# Patient Record
Sex: Female | Born: 1975 | Race: Black or African American | Hispanic: No | Smoking: Current every day smoker
Health system: Southern US, Community
[De-identification: ages and names within clinical notes are randomized; demographics above are authoritative.]

## PROBLEM LIST (undated history)

## (undated) DIAGNOSIS — S86012A Strain of left Achilles tendon, initial encounter: Secondary | ICD-10-CM

## (undated) DIAGNOSIS — J302 Other seasonal allergic rhinitis: Secondary | ICD-10-CM

---

## 1998-01-25 ENCOUNTER — Encounter: Payer: Self-pay | Admitting: Emergency Medicine

## 1998-01-25 ENCOUNTER — Emergency Department (HOSPITAL_COMMUNITY): Admission: EM | Admit: 1998-01-25 | Discharge: 1998-01-25 | Payer: Self-pay | Admitting: Emergency Medicine

## 1999-04-12 ENCOUNTER — Emergency Department (HOSPITAL_COMMUNITY): Admission: EM | Admit: 1999-04-12 | Discharge: 1999-04-12 | Payer: Self-pay | Admitting: Emergency Medicine

## 2001-08-04 ENCOUNTER — Emergency Department (HOSPITAL_COMMUNITY): Admission: EM | Admit: 2001-08-04 | Discharge: 2001-08-04 | Payer: Self-pay

## 2001-08-04 ENCOUNTER — Encounter: Payer: Self-pay | Admitting: Emergency Medicine

## 2006-05-08 ENCOUNTER — Emergency Department (HOSPITAL_COMMUNITY): Admission: EM | Admit: 2006-05-08 | Discharge: 2006-05-08 | Payer: Self-pay | Admitting: Emergency Medicine

## 2006-07-03 ENCOUNTER — Emergency Department (HOSPITAL_COMMUNITY): Admission: EM | Admit: 2006-07-03 | Discharge: 2006-07-03 | Payer: Self-pay | Admitting: Emergency Medicine

## 2006-07-15 ENCOUNTER — Emergency Department (HOSPITAL_COMMUNITY): Admission: EM | Admit: 2006-07-15 | Discharge: 2006-07-15 | Payer: Self-pay | Admitting: Emergency Medicine

## 2006-07-18 ENCOUNTER — Emergency Department (HOSPITAL_COMMUNITY): Admission: EM | Admit: 2006-07-18 | Discharge: 2006-07-18 | Payer: Self-pay | Admitting: Family Medicine

## 2007-07-31 ENCOUNTER — Emergency Department (HOSPITAL_COMMUNITY): Admission: EM | Admit: 2007-07-31 | Discharge: 2007-07-31 | Payer: Self-pay | Admitting: Family Medicine

## 2007-12-30 ENCOUNTER — Emergency Department (HOSPITAL_COMMUNITY): Admission: EM | Admit: 2007-12-30 | Discharge: 2007-12-30 | Payer: Self-pay | Admitting: Emergency Medicine

## 2008-01-22 ENCOUNTER — Inpatient Hospital Stay (HOSPITAL_COMMUNITY): Admission: AD | Admit: 2008-01-22 | Discharge: 2008-01-22 | Payer: Self-pay | Admitting: Obstetrics & Gynecology

## 2008-03-16 ENCOUNTER — Inpatient Hospital Stay (HOSPITAL_COMMUNITY): Admission: EM | Admit: 2008-03-16 | Discharge: 2008-03-18 | Payer: Self-pay | Admitting: Emergency Medicine

## 2008-03-25 ENCOUNTER — Inpatient Hospital Stay (HOSPITAL_COMMUNITY): Admission: EM | Admit: 2008-03-25 | Discharge: 2008-04-06 | Payer: Self-pay | Admitting: Emergency Medicine

## 2008-03-26 ENCOUNTER — Encounter (INDEPENDENT_AMBULATORY_CARE_PROVIDER_SITE_OTHER): Payer: Self-pay | Admitting: Interventional Radiology

## 2008-03-29 ENCOUNTER — Ambulatory Visit: Payer: Self-pay | Admitting: Thoracic Surgery

## 2008-03-30 ENCOUNTER — Encounter: Payer: Self-pay | Admitting: Thoracic Surgery

## 2008-03-30 HISTORY — PX: VIDEO ASSISTED THORACOSCOPY (VATS)/EMPYEMA: SHX6172

## 2008-04-12 ENCOUNTER — Ambulatory Visit: Payer: Self-pay | Admitting: Infectious Diseases

## 2008-04-13 ENCOUNTER — Ambulatory Visit: Payer: Self-pay | Admitting: Thoracic Surgery

## 2008-04-13 ENCOUNTER — Encounter: Admission: RE | Admit: 2008-04-13 | Discharge: 2008-04-13 | Payer: Self-pay | Admitting: Thoracic Surgery

## 2008-05-04 ENCOUNTER — Ambulatory Visit: Payer: Self-pay | Admitting: *Deleted

## 2008-05-04 ENCOUNTER — Ambulatory Visit: Payer: Self-pay | Admitting: Family Medicine

## 2008-05-05 ENCOUNTER — Encounter: Admission: RE | Admit: 2008-05-05 | Discharge: 2008-05-05 | Payer: Self-pay | Admitting: Thoracic Surgery

## 2008-05-05 ENCOUNTER — Ambulatory Visit: Payer: Self-pay | Admitting: Thoracic Surgery

## 2008-12-02 ENCOUNTER — Telehealth (INDEPENDENT_AMBULATORY_CARE_PROVIDER_SITE_OTHER): Payer: Self-pay | Admitting: *Deleted

## 2009-02-03 ENCOUNTER — Ambulatory Visit: Payer: Self-pay | Admitting: Family Medicine

## 2009-07-15 ENCOUNTER — Emergency Department (HOSPITAL_COMMUNITY): Admission: EM | Admit: 2009-07-15 | Discharge: 2009-07-15 | Payer: Self-pay | Admitting: Family Medicine

## 2009-07-15 IMAGING — CR DG CHEST 1V PORT
1 series · 1 of 1 positions shown · non-contrast
Comparison: 04/01/2008

CLINICAL DATA: Pneumonia

PORTABLE CHEST - 1 VIEW

[view not recorded]
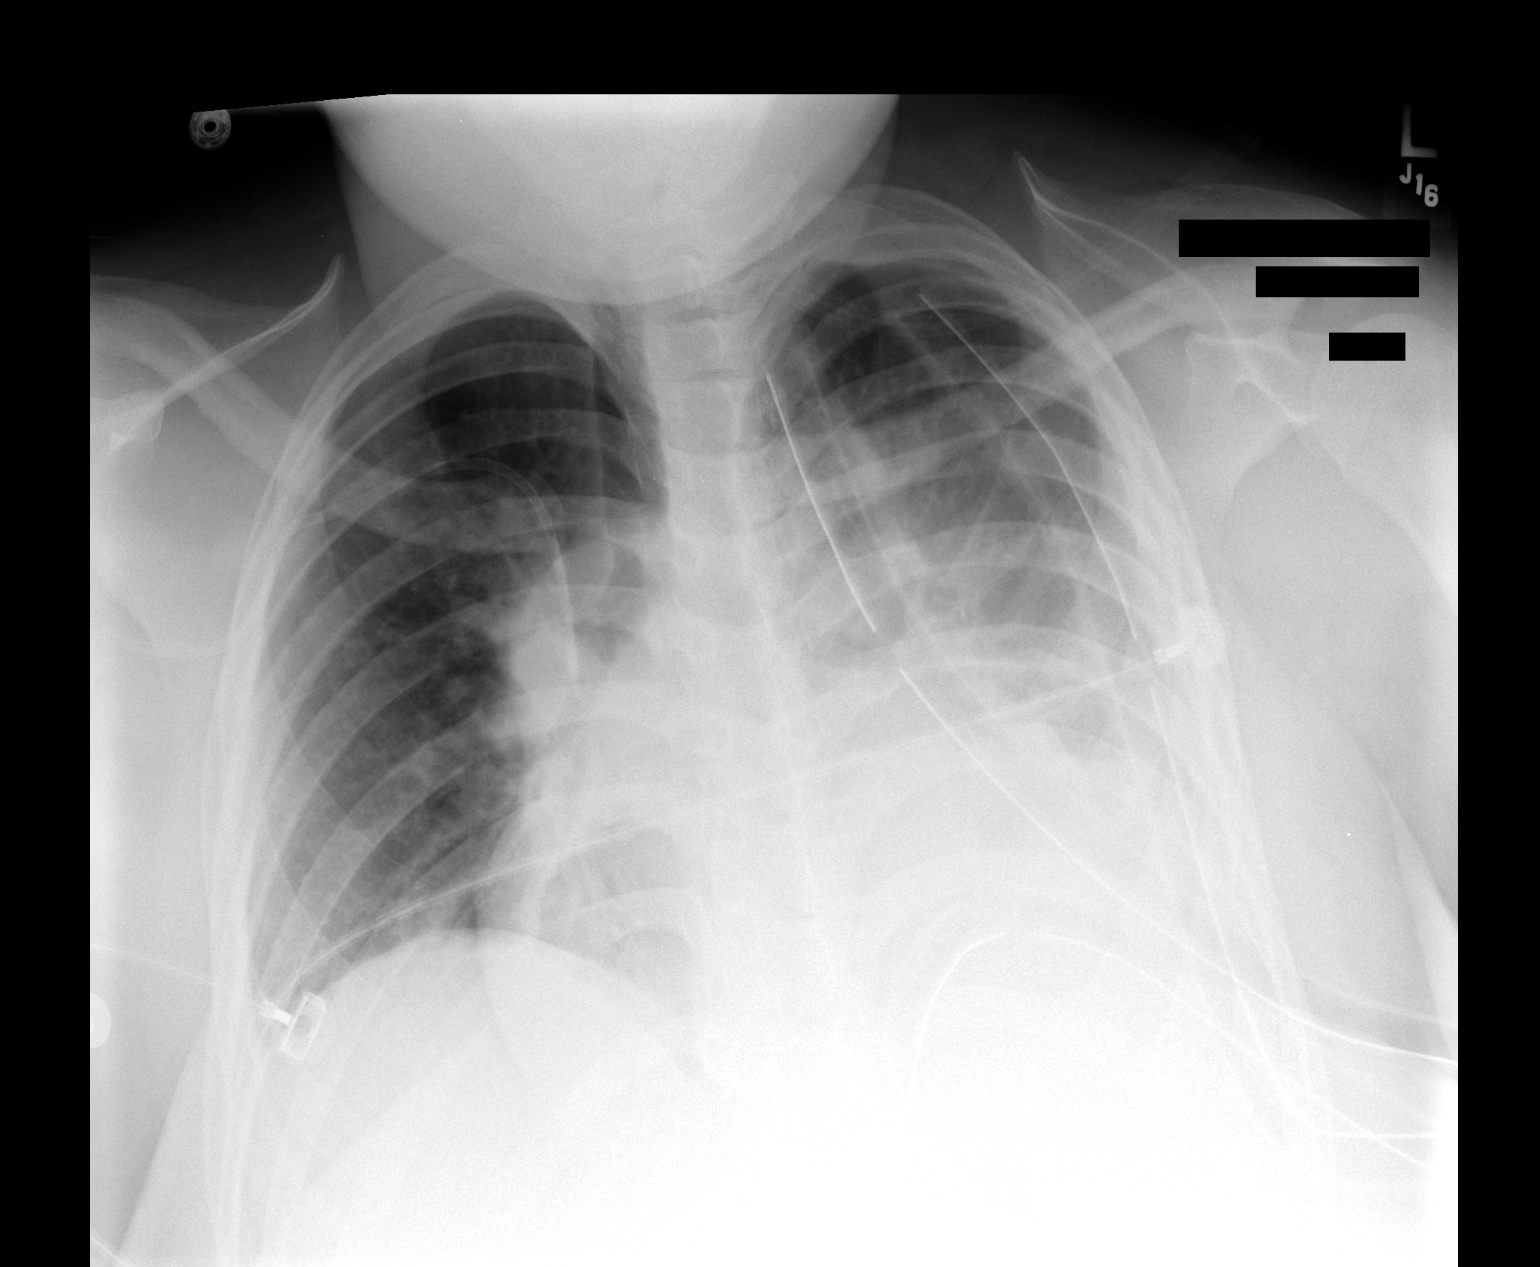

[1 of 1 positions shown; findings below may reference images not displayed]

FINDINGS: Cardiomediastinal silhouette is stable.  No change in
aeration.  No change in left chest tubes position.  No diagnostic
pneumothorax.  No change in the right central venous line position.
Persistent left-sided mild pleural thickening/fluid.
IMPRESSION: No significant change.  Stable aeration.  Stable left chest tubes
position.  No diagnostic pneumothorax.

## 2009-07-18 IMAGING — CR DG CHEST 2V
2 series · 2 of 2 positions shown · non-contrast
Comparison: 04/04/2008

CLINICAL DATA: Pneumonia.

CHEST - 2 VIEW

[w chest pa]
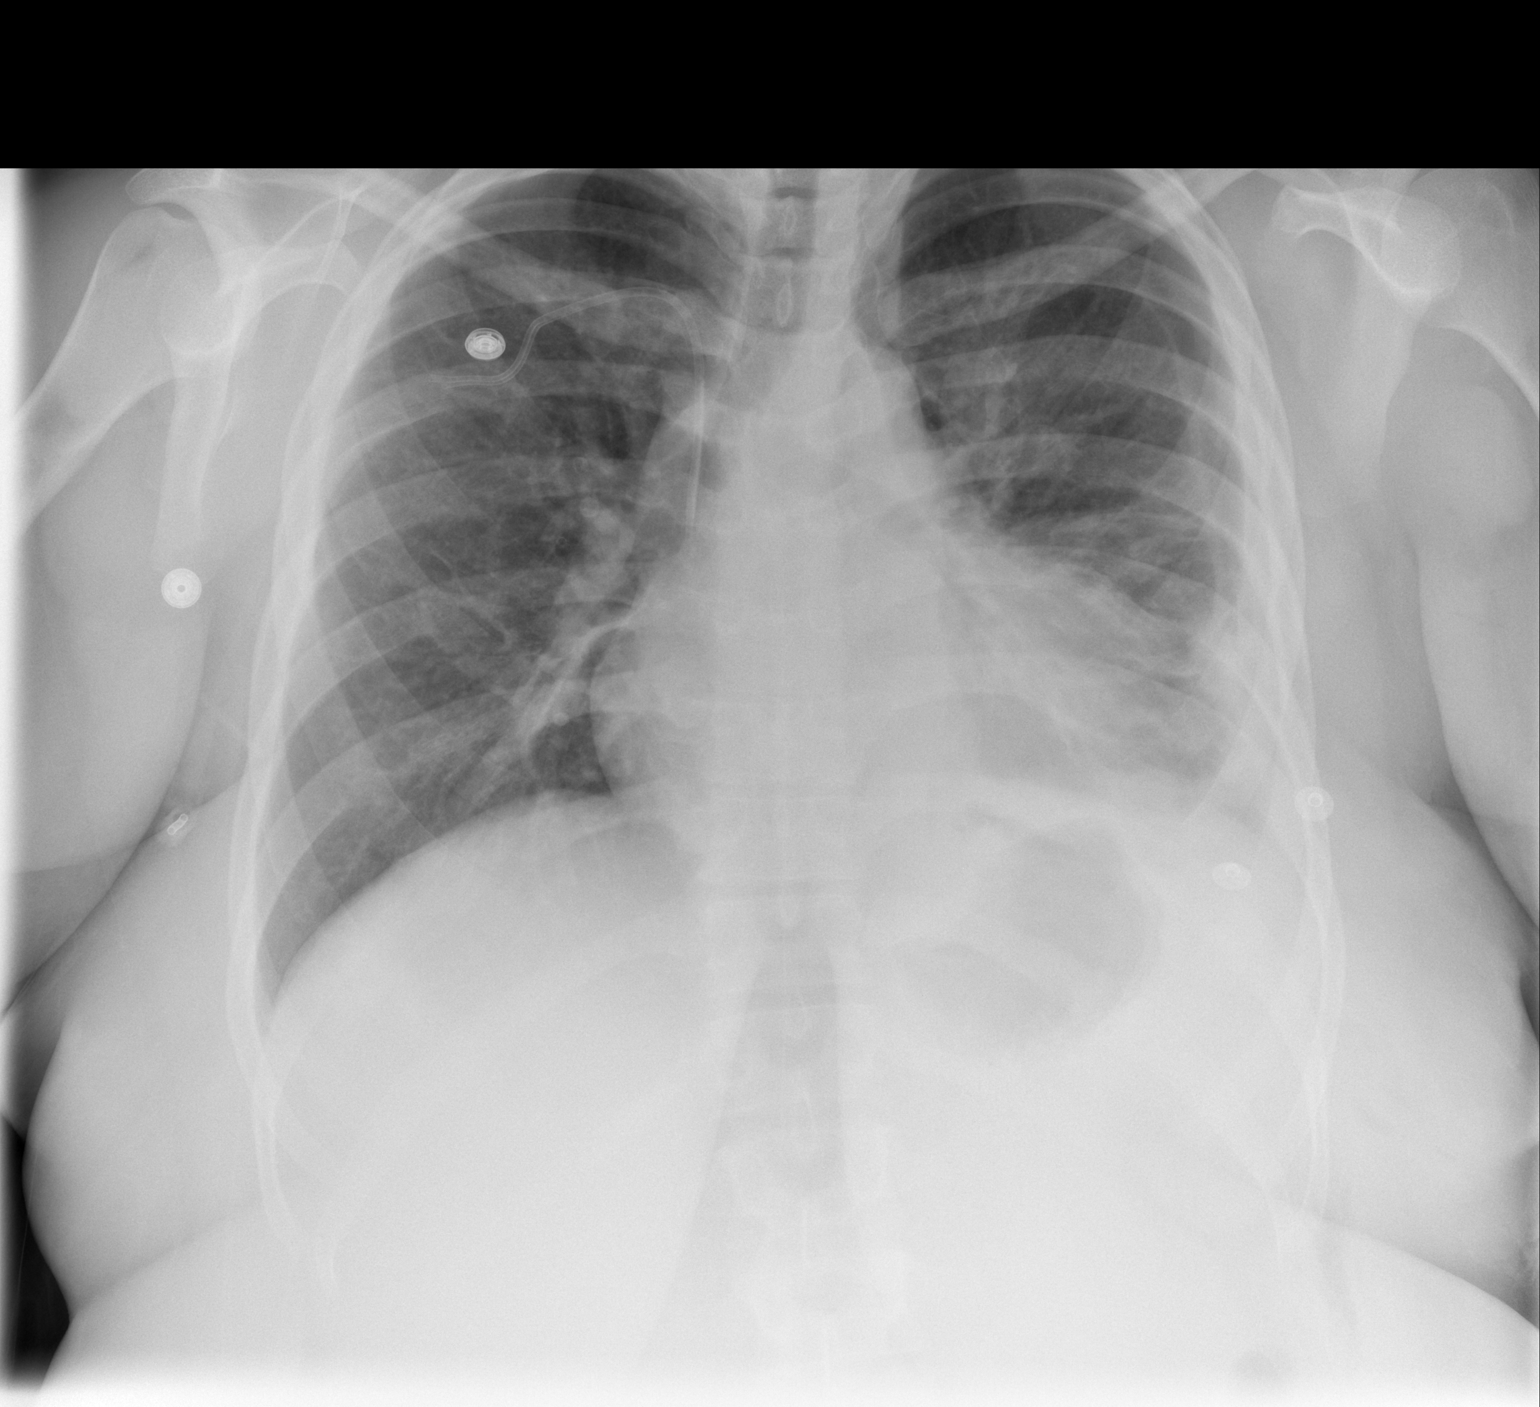

[w chest lat]
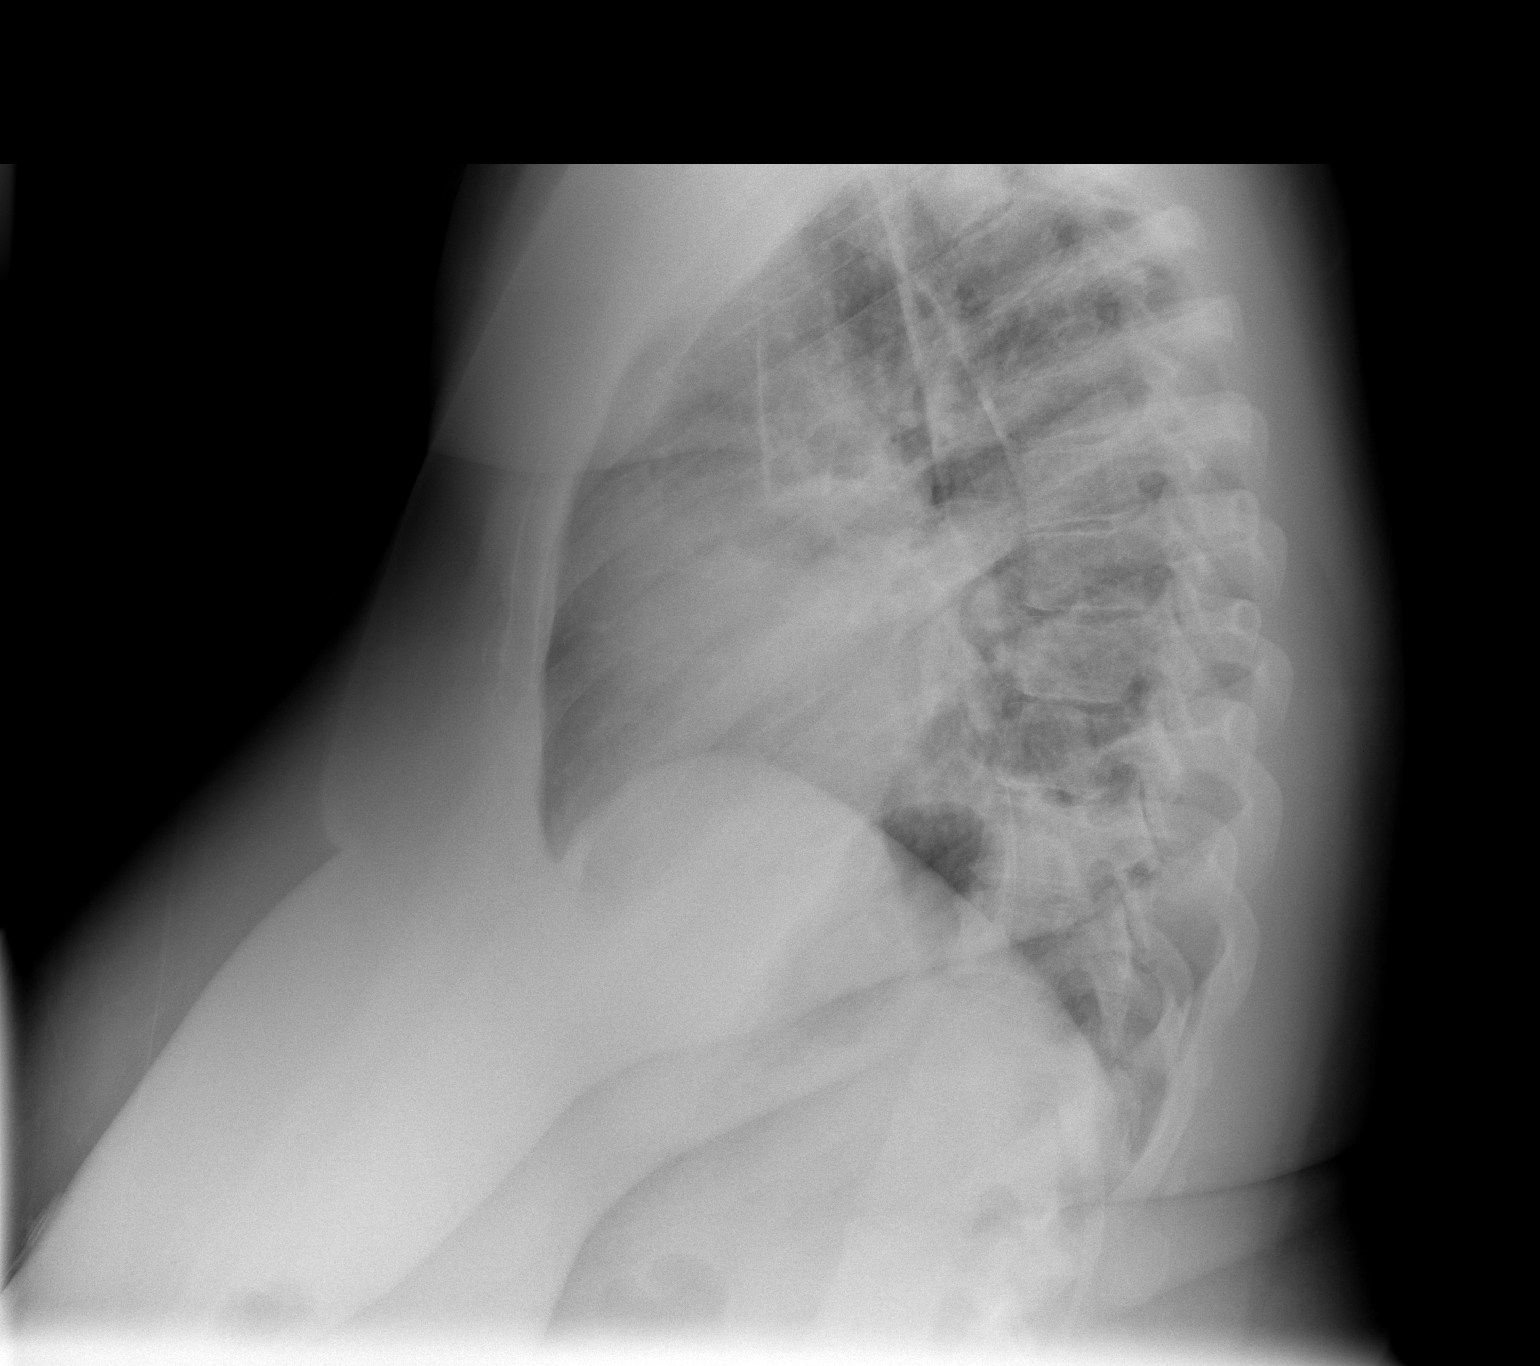

[2 of 2 positions shown; findings below may reference images not displayed]

FINDINGS: Bibasilar opacities are again noted, left greater than
right, unchanged.  Small left effusion present.  There are low lung
volumes.  Mild cardiomegaly.  Right central line is unchanged.  No
pneumothorax.
IMPRESSION: No significant change.

## 2010-07-10 LAB — DIFFERENTIAL
Basophils Absolute: 0 10*3/uL (ref 0.0–0.1)
Basophils Relative: 0 % (ref 0–1)
Basophils Relative: 1 % (ref 0–1)
Eosinophils Relative: 0 % (ref 0–5)
Eosinophils Relative: 1 % (ref 0–5)
Eosinophils Relative: 1 % (ref 0–5)
Lymphocytes Relative: 18 % (ref 12–46)
Lymphs Abs: 1.6 10*3/uL (ref 0.7–4.0)
Monocytes Absolute: 0.8 10*3/uL (ref 0.1–1.0)
Monocytes Absolute: 1 10*3/uL (ref 0.1–1.0)
Neutro Abs: 13.2 10*3/uL — ABNORMAL HIGH (ref 1.7–7.7)
Neutrophils Relative %: 72 % (ref 43–77)
Neutrophils Relative %: 76 % (ref 43–77)

## 2010-07-10 LAB — CBC
HCT: 27.1 % — ABNORMAL LOW (ref 36.0–46.0)
HCT: 28.5 % — ABNORMAL LOW (ref 36.0–46.0)
HCT: 28.7 % — ABNORMAL LOW (ref 36.0–46.0)
HCT: 28.7 % — ABNORMAL LOW (ref 36.0–46.0)
HCT: 29.8 % — ABNORMAL LOW (ref 36.0–46.0)
HCT: 34.5 % — ABNORMAL LOW (ref 36.0–46.0)
Hemoglobin: 10.5 g/dL — ABNORMAL LOW (ref 12.0–15.0)
Hemoglobin: 7.5 g/dL — CL (ref 12.0–15.0)
Hemoglobin: 8.1 g/dL — ABNORMAL LOW (ref 12.0–15.0)
Hemoglobin: 8.6 g/dL — ABNORMAL LOW (ref 12.0–15.0)
Hemoglobin: 8.7 g/dL — ABNORMAL LOW (ref 12.0–15.0)
Hemoglobin: 8.8 g/dL — ABNORMAL LOW (ref 12.0–15.0)
Hemoglobin: 8.9 g/dL — ABNORMAL LOW (ref 12.0–15.0)
Hemoglobin: 9.4 g/dL — ABNORMAL LOW (ref 12.0–15.0)
MCHC: 29.6 g/dL — ABNORMAL LOW (ref 30.0–36.0)
MCHC: 30.6 g/dL (ref 30.0–36.0)
MCHC: 30.6 g/dL (ref 30.0–36.0)
MCHC: 30.7 g/dL (ref 30.0–36.0)
MCHC: 31.8 g/dL (ref 30.0–36.0)
MCHC: 32.1 g/dL (ref 30.0–36.0)
MCV: 69.3 fL — ABNORMAL LOW (ref 78.0–100.0)
MCV: 70.5 fL — ABNORMAL LOW (ref 78.0–100.0)
Platelets: 376 10*3/uL (ref 150–400)
Platelets: 414 10*3/uL — ABNORMAL HIGH (ref 150–400)
Platelets: 425 10*3/uL — ABNORMAL HIGH (ref 150–400)
Platelets: 457 10*3/uL — ABNORMAL HIGH (ref 150–400)
Platelets: 616 10*3/uL — ABNORMAL HIGH (ref 150–400)
RBC: 3.9 MIL/uL (ref 3.87–5.11)
RBC: 4.05 MIL/uL (ref 3.87–5.11)
RBC: 4.09 MIL/uL (ref 3.87–5.11)
RBC: 4.34 MIL/uL (ref 3.87–5.11)
RBC: 4.47 MIL/uL (ref 3.87–5.11)
RBC: 4.98 MIL/uL (ref 3.87–5.11)
RDW: 24.2 % — ABNORMAL HIGH (ref 11.5–15.5)
RDW: 27.9 % — ABNORMAL HIGH (ref 11.5–15.5)
RDW: 28.2 % — ABNORMAL HIGH (ref 11.5–15.5)
RDW: 29.2 % — ABNORMAL HIGH (ref 11.5–15.5)
RDW: 29.6 % — ABNORMAL HIGH (ref 11.5–15.5)
WBC: 10.1 10*3/uL (ref 4.0–10.5)
WBC: 12.2 10*3/uL — ABNORMAL HIGH (ref 4.0–10.5)

## 2010-07-10 LAB — COMPREHENSIVE METABOLIC PANEL
ALT: 44 U/L — ABNORMAL HIGH (ref 0–35)
AST: 19 U/L (ref 0–37)
Albumin: 2 g/dL — ABNORMAL LOW (ref 3.5–5.2)
Albumin: 2.3 g/dL — ABNORMAL LOW (ref 3.5–5.2)
Alkaline Phosphatase: 81 U/L (ref 39–117)
BUN: 5 mg/dL — ABNORMAL LOW (ref 6–23)
Calcium: 7.8 mg/dL — ABNORMAL LOW (ref 8.4–10.5)
Calcium: 8.2 mg/dL — ABNORMAL LOW (ref 8.4–10.5)
Creatinine, Ser: 0.76 mg/dL (ref 0.4–1.2)
Creatinine, Ser: 0.82 mg/dL (ref 0.4–1.2)
GFR calc Af Amer: >60 mL/min (ref >60)
Glucose, Bld: 74 mg/dL (ref 70–99)
Sodium: 134 mEq/L — ABNORMAL LOW (ref 135–145)
Total Protein: 5.6 g/dL — ABNORMAL LOW (ref 6.0–8.3)
Total Protein: 6.7 g/dL (ref 6.0–8.3)

## 2010-07-10 LAB — IRON AND TIBC
Iron: <10 ug/dL — ABNORMAL LOW (ref 42–135)
UIBC: 190 ug/dL

## 2010-07-10 LAB — BASIC METABOLIC PANEL
BUN: 2 mg/dL — ABNORMAL LOW (ref 6–23)
BUN: 3 mg/dL — ABNORMAL LOW (ref 6–23)
CO2: 24 mEq/L (ref 19–32)
CO2: 26 mEq/L (ref 19–32)
CO2: 28 mEq/L (ref 19–32)
CO2: 30 mEq/L (ref 19–32)
Calcium: 7.9 mg/dL — ABNORMAL LOW (ref 8.4–10.5)
Calcium: 8 mg/dL — ABNORMAL LOW (ref 8.4–10.5)
Calcium: 8.2 mg/dL — ABNORMAL LOW (ref 8.4–10.5)
Calcium: 8.3 mg/dL — ABNORMAL LOW (ref 8.4–10.5)
Calcium: 8.3 mg/dL — ABNORMAL LOW (ref 8.4–10.5)
Chloride: 104 mEq/L (ref 96–112)
GFR calc Af Amer: >60 mL/min (ref >60)
GFR calc Af Amer: >60 mL/min (ref >60)
GFR calc Af Amer: >60 mL/min (ref >60)
GFR calc Af Amer: >60 mL/min (ref >60)
GFR calc non Af Amer: >60 mL/min (ref >60)
GFR calc non Af Amer: >60 mL/min (ref >60)
GFR calc non Af Amer: >60 mL/min (ref >60)
GFR calc non Af Amer: >60 mL/min (ref >60)
Glucose, Bld: 124 mg/dL — ABNORMAL HIGH (ref 70–99)
Glucose, Bld: 82 mg/dL (ref 70–99)
Glucose, Bld: 84 mg/dL (ref 70–99)
Glucose, Bld: 95 mg/dL (ref 70–99)
Potassium: 3.6 mEq/L (ref 3.5–5.1)
Potassium: 3.7 mEq/L (ref 3.5–5.1)
Potassium: 3.8 mEq/L (ref 3.5–5.1)
Sodium: 133 mEq/L — ABNORMAL LOW (ref 135–145)
Sodium: 133 mEq/L — ABNORMAL LOW (ref 135–145)
Sodium: 136 mEq/L (ref 135–145)
Sodium: 137 mEq/L (ref 135–145)
Sodium: 139 mEq/L (ref 135–145)

## 2010-07-10 LAB — FERRITIN: Ferritin: 37 ng/mL (ref 10–291)

## 2010-07-10 LAB — CROSSMATCH: Antibody Screen: NEGATIVE

## 2010-07-10 LAB — BODY FLUID CELL COUNT WITH DIFFERENTIAL
Neutrophil Count, Fluid: 61 % — ABNORMAL HIGH (ref 0–25)
Total Nucleated Cell Count, Fluid: 905 cu mm (ref 0–1000)

## 2010-07-10 LAB — FUNGUS CULTURE W SMEAR: Fungal Smear: NONE SEEN

## 2010-07-10 LAB — PROTIME-INR: Prothrombin Time: 14.5 seconds (ref 11.6–15.2)

## 2010-07-10 LAB — ABO/RH: ABO/RH(D): A POS

## 2010-07-10 LAB — POCT I-STAT 3, ART BLOOD GAS (G3+)
Acid-Base Excess: 2 mmol/L (ref 0.0–2.0)
O2 Saturation: 96 %
TCO2: 27 mmol/L (ref 0–100)
pCO2 arterial: 37.5 mmHg (ref 35.0–45.0)
pO2, Arterial: 78 mmHg — ABNORMAL LOW (ref 80.0–100.0)

## 2010-07-10 LAB — TISSUE CULTURE: Culture: NO GROWTH

## 2010-07-10 LAB — GLUCOSE, SEROUS FLUID: Glucose, Fluid: 75 mg/dL

## 2010-07-10 LAB — AFB CULTURE WITH SMEAR (NOT AT ARMC)

## 2010-07-10 LAB — HEMOGLOBIN AND HEMATOCRIT, BLOOD: Hemoglobin: 9.7 g/dL — ABNORMAL LOW (ref 12.0–15.0)

## 2010-07-10 LAB — CARDIAC PANEL(CRET KIN+CKTOT+MB+TROPI)
CK, MB: 0.8 ng/mL (ref 0.3–4.0)
Total CK: 62 U/L (ref 7–177)

## 2010-07-10 LAB — BODY FLUID CULTURE

## 2010-07-10 LAB — VANCOMYCIN, TROUGH: Vancomycin Tr: 7.2 ug/mL — ABNORMAL LOW (ref 10.0–20.0)

## 2010-07-10 LAB — PH, BODY FLUID

## 2010-07-10 LAB — LACTATE DEHYDROGENASE, PLEURAL OR PERITONEAL FLUID

## 2010-07-10 LAB — RETICULOCYTES: Retic Count, Absolute: 63.2 10*3/uL (ref 19.0–186.0)

## 2010-07-10 LAB — AMYLASE, BODY FLUID: Amylase, Fluid: 34 U/L

## 2010-07-10 LAB — POCT I-STAT 4, (NA,K, GLUC, HGB,HCT): HCT: 29 % — ABNORMAL LOW (ref 36.0–46.0)

## 2010-08-08 NOTE — Group Therapy Note (Signed)
Alexis [name], Alexis [name]                 ACCOUNT NO.:  [id number]   MEDICAL RECORD NO.:  [id number]          PATIENT TYPE:  INP   LOCATION:  3310                         FACILITY:  [facility]   PHYSICIAN:  Michelene Gardener, MD    DATE OF BIRTH:  [birth year]                                 PROGRESS NOTE   DATE OF DISCHARGE:  To be determined.   DIAGNOSES:  Includes:  1. Bilateral pneumonia.  2. Empyema status post chest tube insertion.  3. Bronchial asthma which is currently stable.  4. Obesity.  5. Anemia.  6. Tobacco abuse.  7. Mild hyponatremia.   DISCHARGE MEDICATIONS:  To be dictated by the discharging physician.   CONSULTATION:  Vascular surgery consult with Dr. Jovita Gamma.   PROCEDURE:  Chest tube insertion on March 29, 2008.   RADIOLOGY STUDIES:  1. Chest x-ray on March 25, 2008 showed moderate to large left      pleural effusion with associated left lower lobe opacity.  2. CT angiography on March 25, 2008 showed a large left pleural      effusion with complete atelectasis in the left lower lobe and      partial atelectasis in the left upper lobe.  No evidence of      embolism.  3. Chest x-ray on March 26, 2008 showed left thoracentesis with      possible small loculated left pneumothorax.  4. Chest x-ray on March 27, 2008 showed persistent left lower lung      consolidation with stable loculated hydropneumothorax.  5. Chest x-ray on March 28, 2008 showed residual airspace disease in      the left lung associated with loculated pleural effusion.  6. Repeat x-ray on March 29, 2008 showed stable left lower lobe      airspace disease and left hydropneumothorax.  7. Chest x-ray on March 30, 2008 showed placement dose of two left      chest tubes without complications and persistent left effusion and      left lower lobe atelectasis.  8. Chest x-ray on March 31, 2008 showed left upper and lower lobe      airspace disease with loculated left residual fluid.  9.  Chest x-ray on April 01, 2008 showed 3 left-sided chest tubes      without evidence of pneumothorax and persistent left-sided pleural      effusion and airspace disease.  10.Chest x-ray in April 02, 2008 showed no significant change with      stable aeration.  11.Chest x-ray on April 03, 2008 showed no evidence of pneumothorax      following the removal of the more lateral of the left chest tubes.   COURSE OF HOSPITALIZATION.:  1. Pneumonia.  This patient was treated with IV antibiotics.  Her      pneumonia has been followed by daily x-rays.  ID was consulted.      Currently, the patient is on Avelox.  The recommendation is to      change to Avelox p.o. after she is transferred from the Intensive  Care Unit and to continue that for 1 week.  Clinically, the patient      has been doing very well.  She has remained afebrile for the last      few days and her white count is within normal limits.  2. Empyema:  This patient had moderate to large left pleural effusion      at the time of admission that has been followed by serial x-rays.      The patient underwent thoracentesis  March 26, 2008 and her      pleural effusion has been followed.  Vascular Surgery was consulted      and because of her loculation of empyema, chest tubes were inserted      where she had 3 chest tubes.  On April 01, 2008, the most lateral      chest tube was removed.  Another chest tube will be removed on      April 02, 2008 and the last chest tube will be removed on April 03, 2008.  She will still be followed by serial x-rays with Vascular      Surgery.   The patient is currently clinically stable.  She does not have fever,  white count is normal.  Her latest x-rays were showing improvement.  She  is still on antibiotics and will be followed by ID and Vascular Surgery  and an updated discharge summary will be dictated by the discharging  physician. Assessment time is 40 minutes.      Michelene Gardener, MD  Electronically Signed     NAE/MEDQ  D:  04/03/2008  T:  04/03/2008  Job:  [id number]

## 2010-08-08 NOTE — Op Note (Signed)
NAMEMICHAELEEN, DOWN                 ACCOUNT NO.:  [id number]   MEDICAL RECORD NO.:  [id number]          PATIENT TYPE:  INP   LOCATION:  2316                         FACILITY:  [facility]   PHYSICIAN:  Ines Bloomer, M.D. DATE OF BIRTH:  08/18/1975   DATE OF PROCEDURE:  03/30/2008  DATE OF DISCHARGE:                               OPERATIVE REPORT   PREOPERATIVE DIAGNOSIS:  Left chest empyema.   POSTOPERATIVE DIAGNOSIS:  Left chest empyema.   OPERATION PERFORMED:  Left video-assisted thoracic surgery,  minithoracotomy, drainage of empyema with decortication.   SURGEON:  Ines Bloomer, MD   FIRST ASSISTANT:  Coral Ceo, PA-C   ANESTHESIA:  General anesthesia.   DESCRIPTION OF PROCEDURE:  After adequate general anesthesia, the  patient was turned to the left lateral thoracotomy position and prepped  and draped in the usual sterile manner.  Two trocar sites were made in  the anterior and posterior axillary line at the seventh intercostal  space.  Two trocars were inserted and inserting a 0-degree scope, we  could see the patient had an empyema that was quite loculated with  covering of the left lower lobe.  We first started off by draining the  fluid and sent it for culture and then taking out exudate and sent that  for culture and took pictures.  We then used the [facility] ring forceps,  the #2 and #1 forceps to do a parietal pleurectomy and taking off the  exudate off the parietal pleural medially and laterally and taking the  lung down superiorly up to the apex using the scope.  It was obvious  that we had to do a decortication of the lower lobe, so we made a  lateral incision over the sixth intercostal space and split the serratus  anteriorly and entered the sixth intercostal space.  We first freed the  left lower lobe off the diaphragm removing much more exudate, then freed  the left lower lobe off the pericardium and opened the fissure.  We then  did a decortication of left  lower lobe using forceps to remove the peel  and Kitners to help remove the peel until we decorticated the medial  basilar segment and the diaphragmatic surface of the left lower lobe and  then went superiorly up toward the superior segment.  We did not have to  do much decortication of the superior segment.  We really had  decorticated all the basilar segments of the left lower lobe.  Laterally, we took more exudate off the lateral wall, freeing up the  left lower lobe completely.  We then turned our attention to the left  upper lobe taking the lingula off the pericardium and decorticating the  lingula.  We did not have to decorticate the apical posterior segment of  the left upper lobe and decorticated some of the anterior segment and  stripping it off with forceps and Kitners.  After this had been done, we  inserted 3 chest tubes through the trocar sites and sutured in place  with 0 silk.  A Marcaine block was done in  the usual fashion.  A single  On-Q was inserted in the usual fashion.  We then made a third incision  between the first 2 incisions and inserted a right-angle chest tube  and the chest was then closed with 3 pericostal drilling through the  seventh rib and passing around the sixth rib, #1 Vicryl in the muscle  layer, 2-0 Vicryl in the subcutaneous tissue, and Dermabond for the  skin.  The patient was returned to the recovery room in serious  condition.      Ines Bloomer, M.D.  Electronically Signed     DPB/MEDQ  D:  03/30/2008  T:  03/30/2008  Job:  [id number]

## 2010-08-08 NOTE — Assessment & Plan Note (Signed)
OFFICE VISIT   Alexis [name], [name]  DOB:  11-20-1975                                        April 13, 2008  CHART #:  [id number]   The patient came today.  Her blood pressure is 131/84, pulse 72,  respirations 18, and sats were 98%.  Her chest x-ray showed greater  aeration on left and doing much better after her decortication of an  empyema.  Her incisions are well healed.  We removed her chest tube  sutures.  I told her to gradually increase activity.  She could start  driving.  We will see her back again in 3 weeks with a chest x-ray.   Ines Bloomer, M.D.  Electronically Signed   DPB/MEDQ  D:  04/13/2008  T:  04/14/2008  Job:  [id number]

## 2010-08-08 NOTE — Consult Note (Signed)
Alexis [name], Alexis [name]                 ACCOUNT NO.:  [id number]   MEDICAL RECORD NO.:  [id number]          PATIENT TYPE:  INP   LOCATION:  5532                         FACILITY:  [facility]   PHYSICIAN:  Ines Bloomer, M.D. DATE OF BIRTH:  04-15-1975   DATE OF CONSULTATION:  DATE OF DISCHARGE:                                 CONSULTATION   CHIEF COMPLAINT:  Shortness of breath.   HISTORY OF PRESENT ILLNESS:  This 35 year old patient was admitted to  the hospital on March 16, 2008, with bilateral pneumonia and than was  discharged and came back to the [facility], was found to have a  large left pelural effusion, but readmitted after having increasing  shortness of breath, cough.  Against the onset, she has had no chest  pain.  She has a history of asthma in the past.  She was on Avelox for  pneumonia.  CT scan was obtained that showed a large left pleural  effusion with loculations.  She was readmitted to the hospital on  March 25, 2008.  She fairly underwent a thoracocentesis that revealed  only 150 mL of fluid.  She was continued on antiobiotics, Avelox, and  vancomycin and approximately 5 days after admission, we were consulted  for chest tube.  Her white count was 16,000 on admission, it is now  down to 12,400.  She required transfusion.  She was admitted with a  hemoglobin of 12.   PAST MEDICAL HISTORY:  Significant for asthma, morbid obesity, tobacco  abuse, and anemia.   FAMILY HISTORY:  Noncontributory.   SOCIAL HISTORY:  She is married.  She smoked pack a day, had quit  recently after admitted to the hospital.  Occasional alcohol intake.  She has tooth extractions in the past.   ALLERGIES:  PENICILLIN.   MEDICATIONS:  Vancomycin, Lovenox, Avelox, and iron.  She has also been  on albuterol inhaler as well as on doxycycline.   REVIEW OF SYSTEMS:  CARDIAC:  No angina or atrial fibrillation.  GENERAL:  She has been in no weight loss.  PULMONARY:  No hemoptysis.  See history of present illness.  GI:  No nausea, vomiting, constipation  or diarrhea.  No dysuria.  GU:  No dysuria or frequent urination.  VASCULAR: No claudication, DVT, or TIAs.  NEUROLOGICAL:  No dizziness,  headaches, blackouts or seizures.  MUSCULOSKELETAL:  No arthritis or  joint pain.  EYE/ENT:  No changes in his eye sight or hearing.  PSYCHIATRIC:  No psychiatric illnesses.  HEMATOLOGICAL:  No problems  with bleeding or clotting disorders.   PHYSICAL EXAMINATION:  VITAL SIGNS:  Temperature is 99.8, blood pressure  is 120/75, pulse 85, and sats 94% on room air.  HEAD, EYES, NOSE AND THROAT:  Unremarkable.  NECK:  Supple.  No thyromegaly.  There is no supraclavicular or axillary  adenopathy.  CHEST:  Clear to auscultation and percussion.  Left chest reveals  decreased breath sounds in the left side.  CARDIAC:  Regular sinus rhythm.  No murmurs.  ABDOMEN:  Soft.  There is no hepatosplenomegaly.  EXTREMITIES:  Pulses are 2+.  There is no clubbing or edema.  NEUROLOGICAL:  She is oriented x3.  Sensory and motor intact.  Cranial  nerves intact.   IMPRESSION:  1. Left chest empyema and pneumonia.  2. Morbid obesity.  3. History of tobacco abuse.  4. Status post pneumonia with recent discharge.      Ines Bloomer, M.D.  Electronically Signed     DPB/MEDQ  D:  03/29/2008  T:  03/30/2008  Job:  [id number]

## 2010-08-08 NOTE — H&P (Signed)
Alexis [name], Alexis [name] NO.:  [id number]   MEDICAL RECORD NO.:  [id number]          PATIENT TYPE:  INP   LOCATION:                               FACILITY:  [facility]   PHYSICIAN:  Renee Ramus, MD       DATE OF BIRTH:  1975/06/16   DATE OF ADMISSION:  03/25/2008  DATE OF DISCHARGE:                              HISTORY & PHYSICAL   The patient does not have a primary care physician.   HISTORY OF PRESENT ILLNESS:  The patient is a 35 year old female who  presented approximately 2 weeks prior with evidence of pneumonia.  The  patient was placed on Avelox and was discharged to home.  She followed  up in [facility].  She had a repeat  chest x-ray that showed an enlarged left pleural effusion extending  approximately two-thirds of the inferior portion of the left lung.  The  patient has not had increasing cough, fevers, dyspnea, or shortness of  breath.  She has not had chest pain.  She has not had diarrhea or  constipation.  The patient said that she adhered to antibiotic regimen  and took all of her medications.  The patient has no previous history of  this.  The patient has been admitted for further evaluation of left  pleural effusion.   PAST MEDICAL HISTORY:  1. Bronchial asthma.  2. Obesity.  3. Anemia.  4. Tobacco history.   SOCIAL HISTORY:  The patient has smoked 1 pack per day for greater than  15 years but quit approximately 1 month prior to admission.   FAMILY HISTORY:  Not available.   REVIEW OF SYSTEMS:  All other comprehensive review of systems are  negative.   The patient does have a severe allergy to PENICILLIN, which causes  hives.   CURRENT MEDICATIONS:  Albuterol 1-2 puffs inhaled q.6 h. p.r.n. wheezes.  The patient was on Avelox and doxycycline.   PHYSICAL EXAMINATION:  GENERAL:  This is a well-developed, well-  nourished black female, currently in no apparent distress.  VITAL SIGNS:  Blood pressure  115/76, heart rate 112, respiratory rate  22, and temperature 99.8.  NECK:  No jugular venous distention or lymphadenopathy.  HEENT:  Oropharynx is clear.  Mucous membranes pink and moist.  TMs  clear bilaterally.  Pupils equal and reactive to light and  accommodation.  Extraocular muscles are intact.  CARDIOVASCULAR:  Regular rate and rhythm without murmurs, rubs, or  gallops.  PULMONARY:  Lungs clear to auscultation bilaterally with marked  decreased breath sounds at the left base.  ABDOMEN:  Soft, obese, nontender, nondistended without  hepatosplenomegaly.  Bowel sounds are present.  She has no rebound or  guarding.  EXTREMITIES:  She has no clubbing, cyanosis, or edema.  She has good  peripheral pulses and dorsalis pedis and radial arteries.  She is able  to move all extremities.  NEUROLOGIC:  Cranial nerves II-XII are grossly intact.  She has no focal  neurological deficits.   LABORATORY DATA:  White count 16.4, H&H 9.1 and  30.5, MCV 66, platelets  692.  UA is clean.  Chest x-ray shows severe opacification in the  inferior two-thirds portion of the left lung consistent with a large  left pleural effusion.   ASSESSMENT AND PLAN:  1. Left pleural effusion:  Differential diagnosis includes residual      pneumonia versus pulmonary embolism versus other.  The patient has      no clinical signs or symptoms consistent with bacterial infection.      We will check a BNP.  We will obtain a CT with contrast.  We would      consider decubitus views to assess for loculated effusions;      however, the CT should be able to identify this.  The patient      should undergo diagnostic and therapeutic thoracentesis in a.m.  We      will keep her on Avelox, just for precautions but believe the      patient is not actively infected.  We will use O2 as needed and      defer ABG at this time.  2. Anemia:  We will check iron studies and consider increasing iron      supplements.  3. Obesity:  We  will check TSH and free T4.  4. Asthma:  Continue DuoNebs p.r.n., wheezes.  5. Disposition:  The patient is full code.  H&P was constructed by      reviewing past medical history, conferring with emergency medical      room physician, and reviewing the emergency medical room documents,      records, and chart.   TIME SPENT:  One hour.      Renee Ramus, MD  Electronically Signed     JF/MEDQ  D:  03/25/2008  T:  03/26/2008  Job:  [id number]

## 2010-08-08 NOTE — Discharge Summary (Signed)
Alexis [name], Alexis [name]                 ACCOUNT NO.:  [id number]   MEDICAL RECORD NO.:  [id number]          PATIENT TYPE:  INP   LOCATION:  2020                         FACILITY:  [facility]   PHYSICIAN:  Herbie Saxon, MDDATE OF BIRTH:  30-Oct-1975   DATE OF ADMISSION:  03/25/2008  DATE OF DISCHARGE:  04/06/2008                               DISCHARGE SUMMARY   ADDENDUM   This is an addendum to discharge summary previously dictated by Dr.  Arthor Captain on April 03, 2008.   The patient continues to be clinically stable.  IV Avelox has been  discontinued by Infectious Disease today.  She has been cleared for  discharge home by the Cardiothoracic Surgeon.  Hyponatremia and  hypokalemia have been repleted.  Gastroesophageal reflux disease has  been improved with use of Maalox and Protonix.   DISCHARGE CONDITION:  Stable.   DIET:  To be low cholesterol.   ACTIVITY:  Increase slowly as tolerated.   FOLLOWUP:  She is to follow up at the [facility] in the next 5-7  days.  Follow up with Dr. Edwyna Shell, Cardiothoracic Surgeon, in 1 week.   DISCHARGE MEDICATIONS:  1. Avelox 400 mg daily for 3 more days.  2. Lopressor 25 mg twice daily.  3. Percocet 5/325 one to two tablets q.6 h. p.r.n.  4. Maalox 30 mL q.6 h. p.r.n.  5. Ultram 50 mg q.6 h. p.r.n. tablet 1 daily.  6. Multivitamin 1 tablet daily.   PHYSICAL EXAMINATION:  GENERAL:  On examination, she is a young lady not  in acute distress.  VITAL SIGNS:  Temperature is 98, pulse is 80, respiratory rate is 20,  and blood pressure 120/80.  HEAD:  Atraumatic and normocephalic.  Pupils are equal and reactive to  light and accommodation.  Oropharynx and nasopharynx are clear.  NECK:  Supple.  HEART:  Heart sounds 1 and 2 regular.  CHEST:  Clear clinically.  Reduced breath sounds at bases.  ABDOMEN:  Benign.  NEUROLOGIC:  She is alert and oriented x3.  Peripheral pulses present.  No pedal edema.   LABORATORY DATA:  WBC is 10,  hematocrit is 28, and platelet count 414.  Chemistry, sodium is 136, potassium 3.8, chloride 102, bicarb 27,  glucose 84, BUN 5, and creatinine is 0.6.  Repeat chest x-ray on April 06, 2008, shows left pleural effusion, atelectasis and consolidation  within the left mid lung and left base.  The patient made available to  Dr. Edwyna Shell in [facility].      Herbie Saxon, MD  Electronically Signed     MIO/MEDQ  D:  04/06/2008  T:  04/07/2008  Job:  [id number]

## 2010-08-08 NOTE — H&P (Signed)
Alexis [name], Alexis [name]                 ACCOUNT NO.:  [id number]   MEDICAL RECORD NO.:  [id number]          PATIENT TYPE:  INP   LOCATION:  1509                         FACILITY:  [facility]   PHYSICIAN:  Michelene Gardener, MD    DATE OF BIRTH:  [birth year]   DATE OF ADMISSION:  03/16/2008  DATE OF DISCHARGE:                              HISTORY & PHYSICAL   PRIMARY PHYSICIAN:  None.  The patient will be admitted as unassigned.   CHIEF COMPLAINTS:  Increasing shortness of breath, cough and body aches  for the last 2 days.   HISTORY OF PRESENT ILLNESS:  This is a 35 year old African American  female with past medical history of childhood asthma who presented with  the above-mentioned complaints.  For the last 2 days she has been  complaining of increasing shortness of breath with minimal activities.  She has been having cough that started productive yellow sputum and she  saw things of blood coming out.  Her sputum is now clearing and her  cough is becoming more dry.  She also has generalized body aches.  She  feels hot, but she never checked her temperature.  She had pleuritic  chest pain when she coughed.  She came into the ER today because her  symptoms are not improving.  In the ER her she was febrile.  Her white  count is elevated at 27 and her chest x-ray was significant for  bilateral pneumonia.   PAST MEDICAL HISTORY:  1. History of childhood asthma that has been under control and the      last attack was around 15 years ago.  2. Obesity.   PAST SURGICAL HISTORY:  She only had a tooth extraction.  No other major  surgeries.   ALLERGIES:  PENICILLIN that caused hives.   CURRENT MEDICATIONS:  Denied.   SOCIAL HISTORY:  She quit smoking 1 month ago.  Before that she was  smoking one pack per day and she has been smoking for at least 15 years.  She drinks alcohol occasionally.  She denied recreational drugs.   FAMILY HISTORY:  Significant for diabetes and hypertension in both  sides  of her family.   REVIEW OF SYSTEMS:  CONSTITUTIONAL:  Positive for fever and  fatigability.  EYES:  No blurred vision.  ENT:  No tinnitus.  RESPIRATORY:  Positive for dry cough, yellow and red sputum.  Shortness  of breath in addition to pleuritic chest pain.  CARDIOVASCULAR:  No  syncope, no palpitations.  GI:  No nausea or vomiting or diarrhea.  GENITOURINARY:  No dysuria, hematuria, polyuria.  HEMATOLOGY:  No  bleeding.  ID:  No rash.  No lesions.  NEURO:  No numbness or tingling.  The rest of systems reviewed and they were negative.   PHYSICAL EXAMINATION:  VITAL SIGNS:  Temperature is 101.3 heart rate is  82, respiratory rate is 16 was  GENERAL APPEARANCE:  This is a obese [place] American female not in  acute distress.  HEENT:  Her conjunctivae are pink.  Pupils are equal and right.  There  is  no ptosis.  Hearing is intact.  There is no ear discharge or  infection.  There is no nose infection or bleeding.  Oral mucosa dry.  No erythema.  NECK:  Supple.  No JVD, no carotid bruit, no thyroid enlargement or  thyroid tenderness.  CARDIOVASCULAR:  S1, S2 regular.  There are no murmurs or gallops and no  thrills.  RESPIRATORY:  The patient is breathing 16-18.  There is no rales.  There  is no wheezes.  There is mild bilateral rhonchi.  ABDOMEN:  The abdomen is obese, soft, nondistended, no tenderness.  Bowel sounds are present.  EXTREMITIES:  No edema.  No rash and no varicose veins.  SKIN:  No rash and erythema.  NEURO:  Cranial nerves are intact 1-12.  There is no motor or sensory  deficit.   LAB RESULTS:  WBC 27, hemoglobin 9.4, hematocrit 30.4, MCV 65.3,  platelet count is 253,000.  Sodium 130.33, potassium 3.4, chloride 99,  bicarb 24.  Glucose was 113, BUN 9, creatinine 0.93.  Chest x-ray is  positive for bilateral pneumonia.   IMPRESSION:  1. Bilateral pneumonia.  2. History of asthma, not in any exacerbation at the present.  3. Leukocytosis secondary to  pneumonia.  4. Microcytic anemia.  5. Hyponatremia.  6. Hypokalemia.  7. Obesity.   PLAN:  1. Bilateral pneumonia, admit this patient to the hospital.  I will      start her on IV Avelox.  Will get H1N1 swab and meanwhile with keep      her in droplet precautions.  Was sent for sputum and blood      cultures.  Symptomatic treatment with cough medications and      inhalers as needed.  Repeat her chest x-ray tomorrow morning.  We      will also repeat her CBC to follow her white counts.  The patient      is currently hypoxic on 2L of oxygen applied.  I will try to keep      her saturation above 90%.  2. Hypoxemia that was secondary to pneumonia as mentioned above.      Oxygen will be applied to keep saturation above 90%.  3. History of asthma, not in acute exacerbation.  Chest exam is      negative for wheezes.  Will treat her with inhalers as needed.  4. Leukocytosis.  This is secondary to her pneumonia and as mentioned      above.  Will treat her with antibiotics and repeat her white count.  5. Microcytic anemia.  Will get anemia profile and because of her low      MCV I will get hemoglobin electrophoresis.  6. Hyponatremia.  This is mild and no need for further treatment.  7. Hypokalemia.  Will supplement potassium and will repeat dose      tomorrow.  8. Obesity.  The patient was advised about the risk.   Total assessment time is 1 hour.      Michelene Gardener, MD  Electronically Signed     NAE/MEDQ  D:  03/16/2008  T:  03/16/2008  Job:  [id number]

## 2010-08-08 NOTE — Assessment & Plan Note (Signed)
OFFICE VISIT   [name], Alexis R  DOB:  1975/10/01                                        May 05, 2008  CHART #:  [id number]   The patient came today.  Her blood pressure 134/80, pulse 100,  respirations 18, and sats were 98%.  Lungs were clear to auscultation  and percussion.  Heart, regular sinus rhythm.  No murmurs.  Her chest x-  ray looks good after her empyema drained.  Incisions are well healed.  I  will release her back to her medical doctor, and we will see her again  if she has any future problems.   Ines Bloomer, M.D.  Electronically Signed   DPB/MEDQ  D:  05/05/2008  T:  05/05/2008  Job:  [id number]

## 2010-08-08 NOTE — Discharge Summary (Signed)
[name], [name] NO.:  [id number]   MEDICAL RECORD NO.:  [id number]          PATIENT TYPE:  INP   LOCATION:  1509                         [facility]AC[facility]   PHYSICIAN:  Eduard Clos, MDDATE OF BIRTH:  26-Jul-1975   DATE OF ADMISSION:  03/16/2008  DATE OF DISCHARGE:                               DISCHARGE SUMMARY   COURSE IN THE HOSPITAL:  A 35 year old female with a history of  bronchial asthma presented with increasing shortness of breath, fever,  and body ache.  On admission, the patient was found to have a chest x-  ray, which showed bilateral infiltrates.  The patient was admitted for  pneumonia.  The patient also was found to have leukocytosis attributed  to pneumonia.  The patient was started on antibiotics.  Urine cultures  obtained that showed cocci in clusters.  The patient's symptoms  relatively controlled.  The patient was able to ambulate without  assistance.  Able to talk in complete sentences.  Fever has subsided.  There was no shortness of breath or chest pain.  The patient, at this  time, is eager to go home.  As the patient is clinically improved, will  discontinue home with advice to complete antibiotics for complete 1 week  and repeat CMET and CBC pre CT within a week's time.   PROCEDURES DURING THIS STAY:  Chest x-ray on March 16, 2008 shows  left upper lobe and right lower lobe pneumonia versus subsegmental  atelectasis on March 17, 2008.  Chest x-ray shows no changes.  Increasing pleural effusion.   FINAL DIAGNOSIS:  1. Bilateral pneumonia.  2. Leukocytosis secondary to pneumonia.  3. History of bronchial asthma, mildly dependent.   MEDICATIONS AT DISCHARGE:  1. Avelox 400 mg p.o. daily for 7 days.  2. Doxycycline 100 mg p.o. b.i.d. for 7 days.  3. Albuterol HFA 2 puffs q.6 p.r.n.   PLAN:  The patient advised to follow up with primary care physician  within a week's time.  Recheck CMET and CBC at that time with  primary  care physician.  Advised that medications are not to be taking if  planning pregnancy.  Repeat chest x-ray in 1 month's time.  The patient  advised to complete her antibiotic course as advised.      Eduard Clos, MD  Electronically Signed     ANK/MEDQ  D:  03/18/2008  T:  03/18/2008  Job:  [id number]

## 2010-12-25 LAB — POCT URINALYSIS DIP (DEVICE)
Nitrite: NEGATIVE
Operator id: 235561
Protein, ur: NEGATIVE
Urobilinogen, UA: 0.2

## 2010-12-25 LAB — GC/CHLAMYDIA PROBE AMP, GENITAL: GC Probe Amp, Genital: NEGATIVE

## 2010-12-29 LAB — COMPREHENSIVE METABOLIC PANEL
Albumin: 2.4 g/dL — ABNORMAL LOW (ref 3.5–5.2)
BUN: 5 mg/dL — ABNORMAL LOW (ref 6–23)
CO2: 25 mEq/L (ref 19–32)
Chloride: 107 mEq/L (ref 96–112)
Creatinine, Ser: 0.76 mg/dL (ref 0.4–1.2)
GFR calc non Af Amer: >60 mL/min (ref >60)
Glucose, Bld: 99 mg/dL (ref 70–99)
Total Bilirubin: 0.5 mg/dL (ref 0.3–1.2)

## 2010-12-29 LAB — BASIC METABOLIC PANEL
CO2: 24 mEq/L (ref 19–32)
Calcium: 8.9 mg/dL (ref 8.4–10.5)
Chloride: 99 mEq/L (ref 96–112)
GFR calc Af Amer: >60 mL/min (ref >60)
Potassium: 3.4 mEq/L — ABNORMAL LOW (ref 3.5–5.1)
Sodium: 133 mEq/L — ABNORMAL LOW (ref 135–145)

## 2010-12-29 LAB — DIFFERENTIAL
Eosinophils Absolute: 0 10*3/uL (ref 0.0–0.7)
Eosinophils Relative: 0 % (ref 0–5)
Eosinophils Relative: 0 % (ref 0–5)
Lymphs Abs: 1.4 10*3/uL (ref 0.7–4.0)
Lymphs Abs: 2.5 10*3/uL (ref 0.7–4.0)
Monocytes Absolute: 1 10*3/uL (ref 0.1–1.0)
Monocytes Absolute: 1.5 10*3/uL — ABNORMAL HIGH (ref 0.1–1.0)
Monocytes Relative: 3 % (ref 3–12)
Neutro Abs: 12.7 10*3/uL — ABNORMAL HIGH (ref 1.7–7.7)
Neutrophils Relative %: 85 % — ABNORMAL HIGH (ref 43–77)
WBC Morphology: INCREASED

## 2010-12-29 LAB — CULTURE, RESPIRATORY W GRAM STAIN

## 2010-12-29 LAB — CBC
HCT: 25.5 % — ABNORMAL LOW (ref 36.0–46.0)
HCT: 30.5 % — ABNORMAL LOW (ref 36.0–46.0)
Hemoglobin: 9.4 g/dL — ABNORMAL LOW (ref 12.0–15.0)
MCHC: 29.7 g/dL — ABNORMAL LOW (ref 30.0–36.0)
MCHC: 30.9 g/dL (ref 30.0–36.0)
MCV: 65.1 fL — ABNORMAL LOW (ref 78.0–100.0)
MCV: 65.3 fL — ABNORMAL LOW (ref 78.0–100.0)
MCV: 65.3 fL — ABNORMAL LOW (ref 78.0–100.0)
Platelets: 244 10*3/uL (ref 150–400)
RBC: 4.66 MIL/uL (ref 3.87–5.11)
RBC: 4.68 MIL/uL (ref 3.87–5.11)
WBC: 21.8 10*3/uL — ABNORMAL HIGH (ref 4.0–10.5)

## 2010-12-29 LAB — POCT URINALYSIS DIP (DEVICE)
Glucose, UA: NEGATIVE mg/dL
Ketones, ur: NEGATIVE mg/dL
Protein, ur: 100 mg/dL — AB
Specific Gravity, Urine: 1.01 (ref 1.005–1.030)
Urobilinogen, UA: 0.2 mg/dL (ref 0.0–1.0)

## 2010-12-29 LAB — EXPECTORATED SPUTUM ASSESSMENT W GRAM STAIN, RFLX TO RESP C

## 2010-12-29 LAB — MAGNESIUM: Magnesium: 2.1 mg/dL (ref 1.5–2.5)

## 2015-09-24 DIAGNOSIS — S86012A Strain of left Achilles tendon, initial encounter: Secondary | ICD-10-CM

## 2015-09-24 HISTORY — DX: Strain of left Achilles tendon, initial encounter: S86.012A

## 2015-10-14 ENCOUNTER — Encounter (HOSPITAL_BASED_OUTPATIENT_CLINIC_OR_DEPARTMENT_OTHER): Payer: Self-pay | Admitting: *Deleted

## 2015-10-14 ENCOUNTER — Other Ambulatory Visit: Payer: Self-pay | Admitting: Orthopaedic Surgery

## 2015-10-18 ENCOUNTER — Other Ambulatory Visit: Payer: Self-pay | Admitting: Orthopaedic Surgery

## 2015-10-19 ENCOUNTER — Encounter (HOSPITAL_BASED_OUTPATIENT_CLINIC_OR_DEPARTMENT_OTHER)
Admission: RE | Disposition: A | Discharge status: Home / Self Care | Payer: Self-pay | Source: Ambulatory Visit | Attending: Orthopaedic Surgery

## 2015-10-19 ENCOUNTER — Encounter (HOSPITAL_BASED_OUTPATIENT_CLINIC_OR_DEPARTMENT_OTHER): Payer: Self-pay | Admitting: Certified Registered"

## 2015-10-19 ENCOUNTER — Ambulatory Visit (HOSPITAL_BASED_OUTPATIENT_CLINIC_OR_DEPARTMENT_OTHER): Payer: BLUE CROSS/BLUE SHIELD | Admitting: Certified Registered"

## 2015-10-19 ENCOUNTER — Ambulatory Visit (HOSPITAL_BASED_OUTPATIENT_CLINIC_OR_DEPARTMENT_OTHER)
Admission: RE | Admit: 2015-10-19 | Discharge: 2015-10-19 | Disposition: A | Discharge status: Home / Self Care | Payer: BLUE CROSS/BLUE SHIELD | Source: Ambulatory Visit | Attending: Orthopaedic Surgery | Admitting: Orthopaedic Surgery

## 2015-10-19 DIAGNOSIS — Z6841 Body Mass Index (BMI) 40.0 and over, adult: Secondary | ICD-10-CM | POA: Insufficient documentation

## 2015-10-19 DIAGNOSIS — Z880 Allergy status to penicillin: Secondary | ICD-10-CM | POA: Insufficient documentation

## 2015-10-19 DIAGNOSIS — X58XXXA Exposure to other specified factors, initial encounter: Secondary | ICD-10-CM | POA: Diagnosis not present

## 2015-10-19 DIAGNOSIS — J869 Pyothorax without fistula: Secondary | ICD-10-CM | POA: Diagnosis not present

## 2015-10-19 DIAGNOSIS — S86012A Strain of left Achilles tendon, initial encounter: Secondary | ICD-10-CM | POA: Diagnosis present

## 2015-10-19 DIAGNOSIS — F17210 Nicotine dependence, cigarettes, uncomplicated: Secondary | ICD-10-CM | POA: Diagnosis not present

## 2015-10-19 HISTORY — DX: Strain of left Achilles tendon, initial encounter: S86.012A

## 2015-10-19 HISTORY — DX: Other seasonal allergic rhinitis: J30.2

## 2015-10-19 HISTORY — PX: ACHILLES TENDON SURGERY: SHX542

## 2015-10-19 SURGERY — REPAIR, TENDON, ACHILLES
Anesthesia: Regional | Site: Ankle | Laterality: Left

## 2015-10-19 MED ORDER — MIDAZOLAM HCL 2 MG/2ML IJ SOLN
1.0000 mg | INTRAMUSCULAR | Status: DC | PRN
  Administered 2015-10-19 (×2): 1 mg via INTRAVENOUS

## 2015-10-19 MED ORDER — OXYCODONE HCL ER 10 MG PO T12A: 10.0000 mg | EXTENDED_RELEASE_TABLET | Freq: Two times a day (BID) | ORAL | 0 refills | Status: AC

## 2015-10-19 MED ORDER — CLINDAMYCIN PHOSPHATE 900 MG/50ML IV SOLN
INTRAVENOUS | Status: AC
  Filled 2015-10-19: qty 50

## 2015-10-19 MED ORDER — CLINDAMYCIN PHOSPHATE 900 MG/50ML IV SOLN
900.0000 mg | INTRAVENOUS | Status: AC
  Administered 2015-10-19: 900 mg via INTRAVENOUS

## 2015-10-19 MED ORDER — EPHEDRINE SULFATE 50 MG/ML IJ SOLN
INTRAMUSCULAR | Status: DC | PRN
  Administered 2015-10-19: 10 mg via INTRAVENOUS

## 2015-10-19 MED ORDER — LIDOCAINE HCL (CARDIAC) 20 MG/ML IV SOLN
INTRAVENOUS | Status: DC | PRN
  Administered 2015-10-19: 30 mg via INTRAVENOUS

## 2015-10-19 MED ORDER — SCOPOLAMINE 1 MG/3DAYS TD PT72: 1.0000 | MEDICATED_PATCH | Freq: Once | TRANSDERMAL | Status: DC | PRN

## 2015-10-19 MED ORDER — ONDANSETRON HCL 4 MG/2ML IJ SOLN: 4.0000 mg | Freq: Four times a day (QID) | INTRAMUSCULAR | Status: DC | PRN

## 2015-10-19 MED ORDER — ONDANSETRON HCL 4 MG/2ML IJ SOLN
INTRAMUSCULAR | Status: DC | PRN
  Administered 2015-10-19: 4 mg via INTRAVENOUS

## 2015-10-19 MED ORDER — OXYCODONE-ACETAMINOPHEN 5-325 MG PO TABS: 1.0000 | ORAL_TABLET | ORAL | 0 refills | Status: AC | PRN

## 2015-10-19 MED ORDER — ONDANSETRON HCL 4 MG PO TABS: 4.0000 mg | ORAL_TABLET | Freq: Three times a day (TID) | ORAL | 0 refills | Status: AC | PRN

## 2015-10-19 MED ORDER — FENTANYL CITRATE (PF) 100 MCG/2ML IJ SOLN
INTRAMUSCULAR | Status: AC
  Filled 2015-10-19: qty 2

## 2015-10-19 MED ORDER — ASPIRIN EC 325 MG PO TBEC: 325.0000 mg | DELAYED_RELEASE_TABLET | Freq: Two times a day (BID) | ORAL | 0 refills | Status: AC

## 2015-10-19 MED ORDER — 0.9 % SODIUM CHLORIDE (POUR BTL) OPTIME
TOPICAL | Status: DC | PRN
  Administered 2015-10-19: 1700 mL

## 2015-10-19 MED ORDER — HYDROMORPHONE HCL 1 MG/ML IJ SOLN
0.2500 mg | INTRAMUSCULAR | Status: DC | PRN
  Administered 2015-10-19: 0.5 mg via INTRAVENOUS

## 2015-10-19 MED ORDER — LACTATED RINGERS IV SOLN
INTRAVENOUS | Status: DC
  Administered 2015-10-19 (×2): via INTRAVENOUS

## 2015-10-19 MED ORDER — HYDROMORPHONE HCL 1 MG/ML IJ SOLN
INTRAMUSCULAR | Status: AC
  Filled 2015-10-19: qty 1

## 2015-10-19 MED ORDER — FENTANYL CITRATE (PF) 100 MCG/2ML IJ SOLN
50.0000 ug | INTRAMUSCULAR | Status: DC | PRN
  Administered 2015-10-19: 50 ug via INTRAVENOUS

## 2015-10-19 MED ORDER — DEXAMETHASONE SODIUM PHOSPHATE 4 MG/ML IJ SOLN
INTRAMUSCULAR | Status: DC | PRN
  Administered 2015-10-19: 10 mg via INTRAVENOUS

## 2015-10-19 MED ORDER — SUCCINYLCHOLINE CHLORIDE 20 MG/ML IJ SOLN
INTRAMUSCULAR | Status: DC | PRN
  Administered 2015-10-19: 100 mg via INTRAVENOUS

## 2015-10-19 MED ORDER — PROPOFOL 10 MG/ML IV BOLUS
INTRAVENOUS | Status: DC | PRN
  Administered 2015-10-19: 200 mg via INTRAVENOUS

## 2015-10-19 MED ORDER — BUPIVACAINE-EPINEPHRINE (PF) 0.5% -1:200000 IJ SOLN
INTRAMUSCULAR | Status: DC | PRN
  Administered 2015-10-19: 30 mL via PERINEURAL

## 2015-10-19 MED ORDER — SENNOSIDES-DOCUSATE SODIUM 8.6-50 MG PO TABS: 1.0000 | ORAL_TABLET | Freq: Every evening | ORAL | 1 refills | Status: AC | PRN

## 2015-10-19 MED ORDER — METHOCARBAMOL 750 MG PO TABS
750.0000 mg | ORAL_TABLET | Freq: Two times a day (BID) | ORAL | 0 refills | Status: AC | PRN
Start: 2015-10-19 — End: ?

## 2015-10-19 MED ORDER — OXYCODONE HCL 5 MG PO TABS: 5.0000 mg | ORAL_TABLET | Freq: Once | ORAL | Status: DC | PRN

## 2015-10-19 MED ORDER — MIDAZOLAM HCL 2 MG/2ML IJ SOLN
INTRAMUSCULAR | Status: AC
Start: 2015-10-19 — End: 2015-10-19
  Filled 2015-10-19: qty 2

## 2015-10-19 MED ORDER — OXYCODONE HCL 5 MG/5ML PO SOLN: 5.0000 mg | Freq: Once | ORAL | Status: DC | PRN

## 2015-10-19 MED ORDER — GLYCOPYRROLATE 0.2 MG/ML IJ SOLN: 0.2000 mg | Freq: Once | INTRAMUSCULAR | Status: DC | PRN

## 2015-10-19 SURGICAL SUPPLY — 70 items
ANCHOR SUT BIO SW 4.75X19.1 (Anchor) ×6 IMPLANT
BANDAGE ACE 4X5 VEL STRL LF (GAUZE/BANDAGES/DRESSINGS) IMPLANT
BANDAGE ACE 6X5 VEL STRL LF (GAUZE/BANDAGES/DRESSINGS) ×3 IMPLANT
BANDAGE ESMARK 6X9 LF (GAUZE/BANDAGES/DRESSINGS) ×1 IMPLANT
BLADE HEX COATED 2.75 (ELECTRODE) IMPLANT
BLADE SURG 15 STRL LF DISP TIS (BLADE) ×2 IMPLANT
BLADE SURG 15 STRL SS (BLADE) ×4
BNDG COHESIVE 3X5 TAN STRL LF (GAUZE/BANDAGES/DRESSINGS) ×3 IMPLANT
BNDG ESMARK 6X9 LF (GAUZE/BANDAGES/DRESSINGS) ×3
CANISTER SUCT 1200ML W/VALVE (MISCELLANEOUS) ×3 IMPLANT
COVER BACK TABLE 60X90IN (DRAPES) ×3 IMPLANT
CUFF TOURNIQUET SINGLE 24IN (TOURNIQUET CUFF) IMPLANT
CUFF TOURNIQUET SINGLE 34IN LL (TOURNIQUET CUFF) IMPLANT
DECANTER SPIKE VIAL GLASS SM (MISCELLANEOUS) IMPLANT
DRAPE EXTREMITY T 121X128X90 (DRAPE) ×3 IMPLANT
DRAPE SURG 17X23 STRL (DRAPES) ×6 IMPLANT
DRSG PAD ABDOMINAL 8X10 ST (GAUZE/BANDAGES/DRESSINGS) ×3 IMPLANT
DURAPREP 26ML APPLICATOR (WOUND CARE) ×3 IMPLANT
ELECT REM PT RETURN 9FT ADLT (ELECTROSURGICAL) ×3
ELECTRODE REM PT RTRN 9FT ADLT (ELECTROSURGICAL) ×1 IMPLANT
GAUZE SPONGE 4X4 12PLY STRL (GAUZE/BANDAGES/DRESSINGS) ×3 IMPLANT
GAUZE SPONGE 4X4 16PLY XRAY LF (GAUZE/BANDAGES/DRESSINGS) IMPLANT
GAUZE XEROFORM 1X8 LF (GAUZE/BANDAGES/DRESSINGS) ×3 IMPLANT
GLOVE BIO SURGEON STRL SZ 6.5 (GLOVE) ×2 IMPLANT
GLOVE BIO SURGEONS STRL SZ 6.5 (GLOVE) ×1
GLOVE BIOGEL PI IND STRL 7.0 (GLOVE) ×1 IMPLANT
GLOVE BIOGEL PI IND STRL 8 (GLOVE) ×2 IMPLANT
GLOVE BIOGEL PI INDICATOR 7.0 (GLOVE) ×2
GLOVE BIOGEL PI INDICATOR 8 (GLOVE) ×4
GLOVE SKINSENSE NS SZ7.5 (GLOVE) ×2
GLOVE SKINSENSE STRL SZ7.5 (GLOVE) ×1 IMPLANT
GLOVE SURG SYN 7.5  E (GLOVE) ×2
GLOVE SURG SYN 7.5 E (GLOVE) ×1 IMPLANT
GOWN STRL REIN XL XLG (GOWN DISPOSABLE) ×3 IMPLANT
GOWN STRL REUS W/ TWL LRG LVL3 (GOWN DISPOSABLE) ×1 IMPLANT
GOWN STRL REUS W/TWL LRG LVL3 (GOWN DISPOSABLE) ×2
KIT IMPLANT SUTURE PARS (Kit) ×3 IMPLANT
NEEDLE HYPO 22GX1.5 SAFETY (NEEDLE) IMPLANT
NS IRRIG 1000ML POUR BTL (IV SOLUTION) ×3 IMPLANT
PACK BASIN DAY SURGERY FS (CUSTOM PROCEDURE TRAY) ×3 IMPLANT
PAD CAST 3X4 CTTN HI CHSV (CAST SUPPLIES) IMPLANT
PAD CAST 4YDX4 CTTN HI CHSV (CAST SUPPLIES) ×1 IMPLANT
PADDING CAST COTTON 3X4 STRL (CAST SUPPLIES)
PADDING CAST COTTON 4X4 STRL (CAST SUPPLIES) ×2
PADDING CAST COTTON 6X4 STRL (CAST SUPPLIES) ×3 IMPLANT
PADDING CAST SYN 6 (CAST SUPPLIES)
PADDING CAST SYNTHETIC 4 (CAST SUPPLIES)
PADDING CAST SYNTHETIC 4X4 STR (CAST SUPPLIES) IMPLANT
PADDING CAST SYNTHETIC 6X4 NS (CAST SUPPLIES) IMPLANT
PENCIL BUTTON HOLSTER BLD 10FT (ELECTRODE) ×3 IMPLANT
SLEEVE SCD COMPRESS KNEE MED (MISCELLANEOUS) ×3 IMPLANT
SPLINT FIBERGLASS 3X35 (CAST SUPPLIES) IMPLANT
SPLINT FIBERGLASS 4X30 (CAST SUPPLIES) ×3 IMPLANT
SPONGE LAP 18X18 X RAY DECT (DISPOSABLE) ×6 IMPLANT
STAPLER VISISTAT (STAPLE) IMPLANT
SUCTION FRAZIER HANDLE 10FR (MISCELLANEOUS)
SUCTION TUBE FRAZIER 10FR DISP (MISCELLANEOUS) IMPLANT
SUT ETHILON 2 0 FS 18 (SUTURE) IMPLANT
SUT ETHILON 3 0 PS 1 (SUTURE) ×6 IMPLANT
SUT FIBERWIRE #2 38 T-5 BLUE (SUTURE)
SUT VIC AB 2-0 CT1 27 (SUTURE)
SUT VIC AB 2-0 CT1 TAPERPNT 27 (SUTURE) IMPLANT
SUTURE FIBERWR #2 38 T-5 BLUE (SUTURE) IMPLANT
SYR BULB 3OZ (MISCELLANEOUS) ×3 IMPLANT
SYR CONTROL 10ML LL (SYRINGE) IMPLANT
TOWEL OR 17X24 6PK STRL BLUE (TOWEL DISPOSABLE) ×3 IMPLANT
TUBE CONNECTING 20'X1/4 (TUBING)
TUBE CONNECTING 20X1/4 (TUBING) IMPLANT
UNDERPAD 30X30 (UNDERPADS AND DIAPERS) ×3 IMPLANT
YANKAUER SUCT BULB TIP NO VENT (SUCTIONS) IMPLANT

## 2015-10-19 NOTE — Transfer of Care (Signed)
Immediate Anesthesia Transfer of Care Note  Patient: Alexis [name]  Procedure(s) Performed: Procedure(s): LEFT ACHILLES TENDON REPAIR (Left)  Patient Location: PACU  Anesthesia Type:GA combined with regional for post-op pain  Level of Consciousness: awake, alert , oriented and patient cooperative  Airway & Oxygen Therapy: Patient Spontanous Breathing and Patient connected to face mask oxygen  Post-op Assessment: Report given to RN and Post -op Vital signs reviewed and stable  Post vital signs: Reviewed and stable  Last Vitals:  Vitals:   10/19/15 1105 10/19/15 1110  BP:    Pulse: 64   Resp: 15 16  Temp:      Last Pain:  Vitals:   10/19/15 1012  TempSrc: Oral  PainSc:          Complications: No apparent anesthesia complications

## 2015-10-19 NOTE — Anesthesia Preprocedure Evaluation (Addendum)
Anesthesia Evaluation  Patient identified by MRN, date of birth, ID band Patient awake    Reviewed: Allergy & Precautions, NPO status , Patient's Chart, lab work & pertinent test results  Airway Mallampati: II   Neck ROM: full    Dental   Pulmonary Current Smoker,  VATS 03/2008 for empyema   breath sounds clear to auscultation       Cardiovascular negative cardio ROS   Rhythm:regular Rate:Normal     Neuro/Psych    GI/Hepatic   Endo/Other  Morbid obesity  Renal/GU      Musculoskeletal   Abdominal   Peds  Hematology   Anesthesia Other Findings   Reproductive/Obstetrics                            Anesthesia Physical Anesthesia Plan  ASA: II  Anesthesia Plan: General and Regional   Post-op Pain Management:  Regional for Post-op pain   Induction: Intravenous  Airway Management Planned: Oral ETT  Additional Equipment:   Intra-op Plan:   Post-operative Plan: Extubation in OR  Informed Consent: I have reviewed the patients History and Physical, chart, labs and discussed the procedure including the risks, benefits and alternatives for the proposed anesthesia with the patient or authorized representative who has indicated his/her understanding and acceptance.     Plan Discussed with: CRNA, Anesthesiologist and Surgeon  Anesthesia Plan Comments:         Anesthesia Quick Evaluation

## 2015-10-19 NOTE — H&P (Signed)
    PREOPERATIVE H&P  Chief Complaint: left achilles rupture  HPI: Alexis [name] is a 40 y.o. female who presents for surgical treatment of left achilles rupture.  She denies any changes in medical history.  Past Medical History:  Diagnosis Date  . Achilles rupture, left 09/2015  . Seasonal allergies    Past Surgical History:  Procedure Laterality Date  . VIDEO ASSISTED THORACOSCOPY (VATS)/EMPYEMA Left 03/30/2008   Social History   Social History  . Marital status: Legally Separated    Spouse name: N/A  . Number of children: N/A  . Years of education: N/A   Social History Main Topics  . Smoking status: Current Every Day Smoker    Packs/day: 0.50    Years: 10.00    Types: Cigarettes  . Smokeless tobacco: Never Used  . Alcohol use Yes     Comment: occasionally  . Drug use: No  . Sexual activity: Not Asked   Other Topics Concern  . None   Social History Narrative  . None   History reviewed. No pertinent family history. Allergies  Allergen Reactions  . Banana Itching and Nausea And Vomiting  . Penicillins Hives  . Apple Itching  . Soap Rash and Other (See Comments)    YEAST INFECTION   Prior to Admission medications   Medication Sig Start Date End Date Taking? Authorizing Provider  acyclovir (ZOVIRAX) 800 MG tablet Take 800 mg by mouth 2 (two) times daily.   Yes Historical Provider, MD  montelukast (SINGULAIR) 10 MG tablet Take 10 mg by mouth at bedtime.   Yes Historical Provider, MD  triamcinolone cream (KENALOG) 0.1 % Apply 1 application topically 2 (two) times daily.   Yes Historical Provider, MD     Positive ROS: All other systems have been reviewed and were otherwise negative with the exception of those mentioned in the HPI and as above.  Physical Exam: General: Alert, no acute distress Cardiovascular: No pedal edema Respiratory: No cyanosis, no use of accessory musculature GI: abdomen soft Skin: No lesions in the area of chief complaint Neurologic:  Sensation intact distally Psychiatric: Patient is competent for consent with normal mood and affect Lymphatic: no lymphedema  MUSCULOSKELETAL: exam stable  Assessment: left achilles rupture  Plan: Plan for Procedure(s): LEFT ACHILLES TENDON REPAIR  The risks benefits and alternatives were discussed with the patient including but not limited to the risks of nonoperative treatment, versus surgical intervention including infection, bleeding, nerve injury,  blood clots, cardiopulmonary complications, morbidity, mortality, among others, and they were willing to proceed.   Cheral Almas, MD   10/19/2015 11:05 AM

## 2015-10-19 NOTE — Op Note (Signed)
   Date of Surgery: 10/19/2015  INDICATIONS: Alexis [name] is a 40 y.o.-year-old female who sustained an acute left achilles rupture. The risks and benefits of the procedure discussed with the patient prior to the procedure and all questions were answered; consent was obtained.  PREOPERATIVE DIAGNOSIS: left achilles rupture, acute  POSTOPERATIVE DIAGNOSIS: Same   PROCEDURE: Treatment of left achilles rupture , without graft  SURGEON: Alexis [name], M.D.   ANESTHESIA: general   IV FLUIDS AND URINE: See anesthesia record   ESTIMATED BLOOD LOSS: minimal  IMPLANTS: Ar[employer] PARS system and 4.75 mm swivel lock x 2  COMPLICATIONS: None.   DESCRIPTION OF PROCEDURE: The patient was brought to the operating room and placed prone on the operating table. The patient's leg had been signed prior to the procedure. The patient had the anesthesia placed by the anesthesiologist. The prep verification and incision time-outs were performed to confirm that this was the correct patient, site, side and location. The patient had an SCD on the opposite lower extremity. A 2 cm longitudinal incision based over the rupture was used.  Blunt dissection was taken down to the level of the paratenon.  The paratenon was sharply incised in line with the incision.  The rupture was exposed.  A space was created between the paratenon and the achilles tendon on both sides going up and down the tendon.  The sural nerve was visualized and protected during the procedure.  An alise clamp was used to pull tension on the tendon end.  The sled was advanced up the tendon proximally making sure the tendon was straddled between the arms.  The sutures were passed sequentially and then brought out through the incision.  The foot was then placed in maximum plantarflexion and the sutures were anchored down to the calcaneus using 2 4.75 mm swivel locks through 2 separate stab incisions.  The wound was then irrigated thoroughly.  The paratenon was was  closed using 0 vicryl.  The subcutaneous layer was closed with 3-0 vicryl and the skin with interrupted 3-0 nylon.  A sterile dressing was applied.  A short leg splint was placed with the ankle in maximum plantarflexion.  The patient awoke from anesthesia uneventfully and transported to the PACU.  POSTOPERATIVE PLAN: The patient will be non weight bearing for two weeks and return for suture removal and transition to a fracture boot.  The patient will be placed on DVT chemoprophylaxis.  Mayra Reel, MD [facility] [phone] 12:36 PM

## 2015-10-19 NOTE — Progress Notes (Signed)
Assisted Dr. Hodierne with left, ultrasound guided, popliteal block. Side rails up, monitors on throughout procedure. See vital signs in flow sheet. Tolerated Procedure well. 

## 2015-10-19 NOTE — Anesthesia Postprocedure Evaluation (Signed)
Anesthesia Post Note  Patient: Alexis [name]  Procedure(s) Performed: Procedure(s) (LRB): LEFT ACHILLES TENDON REPAIR (Left)  Patient location during evaluation: PACU Anesthesia Type: General and Regional Level of consciousness: awake and alert Pain management: pain level controlled Vital Signs Assessment: post-procedure vital signs reviewed and stable Respiratory status: spontaneous breathing, nonlabored ventilation, respiratory function stable and patient connected to nasal cannula oxygen Cardiovascular status: blood pressure returned to baseline and stable Postop Assessment: no signs of nausea or vomiting Anesthetic complications: no    Last Vitals:  Vitals:   10/19/15 1330 10/19/15 1345  BP: 117/65 119/73  Pulse: 92 90  Resp: 18 20  Temp:      Last Pain:  Vitals:   10/19/15 1415  TempSrc:   PainSc: 2     LLE Motor Response: Purposeful movement (10/19/15 1415) LLE Sensation: Pain (10/19/15 1415)          Judyann Casasola J

## 2015-10-19 NOTE — Discharge Instructions (Signed)
° ° °  1. Keep splint clean and dry °2. Elevate foot above level of the heart °3. Take aspirin to prevent blood clots °4. Take pain meds as needed °5. Strict non weight bearing to operative extremity ° ° ° ° °Regional Anesthesia Blocks ° °1. Numbness or the inability to move the "blocked" extremity may last from 3-48 hours after placement. The length of time depends on the medication injected and your individual response to the medication. If the numbness is not going away after 48 hours, call your surgeon. ° °2. The extremity that is blocked will need to be protected until the numbness is gone and the  Strength has returned. Because you cannot feel it, you will need to take extra care to avoid injury. Because it may be weak, you may have difficulty moving it or using it. You may not know what position it is in without looking at it while the block is in effect. ° °3. For blocks in the legs and feet, returning to weight bearing and walking needs to be done carefully. You will need to wait until the numbness is entirely gone and the strength has returned. You should be able to move your leg and foot normally before you try and bear weight or walk. You will need someone to be with you when you first try to ensure you do not fall and possibly risk injury. ° °4. Bruising and tenderness at the needle site are common side effects and will resolve in a few days. ° °5. Persistent numbness or new problems with movement should be communicated to the surgeon or the [facility] ([phone])/ [facility] ([phone]). ° ° ° ° ° °Post Anesthesia Home Care Instructions ° °Activity: °Get plenty of rest for the remainder of the day. A responsible adult should stay with you for 24 hours following the procedure.  °For the next 24 hours, DO NOT: °-Drive a car °-Operate machinery °-Drink alcoholic beverages °-Take any medication unless instructed by your physician °-Make any legal decisions or sign important  papers. ° °Meals: °Start with liquid foods such as gelatin or soup. Progress to regular foods as tolerated. Avoid greasy, spicy, heavy foods. If nausea and/or vomiting occur, drink only clear liquids until the nausea and/or vomiting subsides. Call your physician if vomiting continues. ° °Special Instructions/Symptoms: °Your throat may feel dry or sore from the anesthesia or the breathing tube placed in your throat during surgery. If this causes discomfort, gargle with warm salt water. The discomfort should disappear within 24 hours. ° °If you had a scopolamine patch placed behind your ear for the management of post- operative nausea and/or vomiting: ° °1. The medication in the patch is effective for 72 hours, after which it should be removed.  Wrap patch in a tissue and discard in the trash. Wash hands thoroughly with soap and water. °2. You may remove the patch earlier than 72 hours if you experience unpleasant side effects which may include dry mouth, dizziness or visual disturbances. °3. Avoid touching the patch. Wash your hands with soap and water after contact with the patch. °  ° °

## 2015-10-19 NOTE — Anesthesia Procedure Notes (Signed)
Procedure Name: Intubation Date/Time: 10/19/2015 11:14 AM Performed by: Niyonna Betsill D Pre-anesthesia Checklist: Patient identified, Emergency Drugs available, Suction available and Patient being monitored Patient Re-evaluated:Patient Re-evaluated prior to inductionOxygen Delivery Method: Circle system utilized Preoxygenation: Pre-oxygenation with 100% oxygen Intubation Type: IV induction Ventilation: Mask ventilation without difficulty Laryngoscope Size: Mac and 3 Grade View: Grade I Tube type: Oral Tube size: 7.0 mm Number of attempts: 1 Airway Equipment and Method: Stylet and Oral airway Placement Confirmation: ETT inserted through vocal cords under direct vision,  positive ETCO2 and breath sounds checked- equal and bilateral Secured at: 21 cm Tube secured with: Tape Dental Injury: Teeth and Oropharynx as per pre-operative assessment

## 2015-10-19 NOTE — Anesthesia Procedure Notes (Signed)
Anesthesia Regional Block:  Popliteal block  Pre-Anesthetic Checklist: ,, timeout performed, Correct Patient, Correct Site, Correct Laterality, Correct Procedure, Correct Position, site marked, Risks and benefits discussed,  Surgical consent,  Pre-op evaluation,  At surgeon's request and post-op pain management  Laterality: Left  Prep: chloraprep       Needles:  Injection technique: Single-shot  Needle Type: Echogenic Stimulator Needle     Needle Length:cm 9 cm Needle Gauge: 21 G    Additional Needles:  Procedures: ultrasound guided (picture in chart) and nerve stimulator Popliteal block  Nerve Stimulator or Paresthesia:  Response: plantar flexion of foot, 0.45 mA,   Additional Responses:   Narrative:  Start time: 10/19/2015 10:53 AM End time: 10/19/2015 11:00 AM Injection made incrementally with aspirations every 5 mL.  Performed by: Personally  Anesthesiologist: Keval Nam  Additional Notes: Functioning IV was confirmed and monitors were applied.  A 36mm 21ga Arrow echogenic stimulator needle was used. Sterile prep and drape,hand hygiene and sterile gloves were used.  Negative aspiration and negative test dose prior to incremental administration of local anesthetic. The patient tolerated the procedure well.  Ultrasound guidance: relevent anatomy identified, needle position confirmed, local anesthetic spread visualized around nerve(s), vascular puncture avoided.  Image printed for medical record.

## 2015-10-24 ENCOUNTER — Encounter (HOSPITAL_BASED_OUTPATIENT_CLINIC_OR_DEPARTMENT_OTHER): Payer: Self-pay | Admitting: Orthopaedic Surgery

## 2016-01-12 ENCOUNTER — Ambulatory Visit (INDEPENDENT_AMBULATORY_CARE_PROVIDER_SITE_OTHER): Payer: BLUE CROSS/BLUE SHIELD | Admitting: Orthopaedic Surgery

## 2016-01-12 DIAGNOSIS — S86012D Strain of left Achilles tendon, subsequent encounter: Secondary | ICD-10-CM
